# Patient Record
Sex: Male | Born: 1980 | Race: Black or African American | Hispanic: No | Marital: Single
Health system: Southern US, Community
[De-identification: ages and names within clinical notes are randomized; demographics above are authoritative.]

---

## 2018-08-14 ENCOUNTER — Encounter (HOSPITAL_COMMUNITY): Payer: Self-pay | Admitting: Emergency Medicine

## 2018-08-14 ENCOUNTER — Other Ambulatory Visit: Payer: Self-pay

## 2018-08-14 ENCOUNTER — Ambulatory Visit (HOSPITAL_COMMUNITY)
Admission: EM | Admit: 2018-08-14 | Discharge: 2018-08-14 | Disposition: A | Discharge status: Home / Self Care | Payer: BLUE CROSS/BLUE SHIELD | Attending: Family Medicine | Admitting: Family Medicine

## 2018-08-14 DIAGNOSIS — R110 Nausea: Secondary | ICD-10-CM

## 2018-08-14 DIAGNOSIS — B349 Viral infection, unspecified: Secondary | ICD-10-CM

## 2018-08-14 MED ORDER — IBUPROFEN 800 MG PO TABS: 800.0000 mg | ORAL_TABLET | Freq: Three times a day (TID) | ORAL | 0 refills | Status: AC

## 2018-08-14 MED ORDER — ONDANSETRON 4 MG PO TBDP: 4.0000 mg | ORAL_TABLET | Freq: Three times a day (TID) | ORAL | 0 refills | Status: AC | PRN

## 2018-08-14 NOTE — Discharge Instructions (Addendum)
Ibuprofen for pain and fever. Zofran for nausea/vomiting. Keep hydrated, your urine should be clear to pale yellow in color. Monitor for any worsening of symptoms, chest pain, shortness of breath, wheezing, swelling of the throat, follow up for reevaluation.   For sore throat/cough try using a honey-based tea. Use 3 teaspoons of honey with juice squeezed from half lemon. Place shaved pieces of ginger into 1/2-1 cup of water and warm over stove top. Then mix the ingredients and repeat every 4 hours as needed.

## 2018-08-14 NOTE — ED Provider Notes (Signed)
[facility]    CSN: [id number] Arrival date & time: 08/14/18  1111     History   Chief Complaint Chief Complaint  Patient presents with  . Fever    HPI Wesley Diaz is a 37 y.o. male.   37 year old male comes in for 2 day history of cold chills with decreased appetite. Patient speaks S[place], but we did not have Sales executive available. Patient's friend speaks french and sango, so french interpreter was used during this visit. [name] #[id number]. Discussed due to language barrier, may not be able to fully assess patient.  Patient with 2-day history of cold chills with decreased appetite, which she usually associates with having a fever.  He has rhinorrhea and watery eyes, nasal congestion and generalized body aches.  He has nausea when he eats without vomiting.  Denies abdominal pain, diarrhea.  Denies cough.  Former smoker.  No sick contact.  Has not tried any medication.     History reviewed. No pertinent past medical history.  There are no active problems to display for this patient.   History reviewed. No pertinent surgical history.     Home Medications    Prior to Admission medications   Medication Sig Start Date End Date Taking? Authorizing Provider  ibuprofen (ADVIL,MOTRIN) 800 MG tablet Take 1 tablet (800 mg total) by mouth 3 (three) times daily. 08/14/18   Cathie Hoops, Amy V, PA-C  ondansetron (ZOFRAN ODT) 4 MG disintegrating tablet Take 1 tablet (4 mg total) by mouth every 8 (eight) hours as needed for nausea or vomiting. 08/14/18   Belinda Fisher, PA-C    Family History History reviewed. No pertinent family history.  Social History Social History   Tobacco Use  . Smoking status: Not on file  Substance Use Topics  . Alcohol use: Not on file  . Drug use: Not on file     Allergies   Patient has no known allergies.   Review of Systems Review of Systems  Reason unable to perform ROS: See HPI as above.     Physical Exam Triage Vital Signs ED  Triage Vitals [08/14/18 1202]  Enc Vitals Group     BP 112/68     Pulse Rate 92     Resp      Temp 98.3 F (36.8 C)     Temp Source Oral     SpO2 97 %     Weight      Height      Head Circumference      Peak Flow      Pain Score      Pain Loc      Pain Edu?      Excl. in GC?    No data found.  Updated Vital Signs BP 112/68 (BP Location: Left Arm)   Pulse 92   Temp 98.3 F (36.8 C) (Oral)   SpO2 97%   Physical Exam  Constitutional: He is oriented to person, place, and time. He appears well-developed and well-nourished. No distress.  HENT:  Head: Normocephalic and atraumatic.  Right Ear: Tympanic membrane, external ear and ear canal normal. Tympanic membrane is not erythematous and not bulging.  Left Ear: Tympanic membrane, external ear and ear canal normal. Tympanic membrane is not erythematous and not bulging.  Nose: Nose normal. Right sinus exhibits no maxillary sinus tenderness and no frontal sinus tenderness. Left sinus exhibits no maxillary sinus tenderness and no frontal sinus tenderness.  Mouth/Throat: Uvula is midline, oropharynx is clear  and moist and mucous membranes are normal.  Eyes: Pupils are equal, round, and reactive to light. Conjunctivae are normal.  Neck: Normal range of motion. Neck supple.  Cardiovascular: Normal rate, regular rhythm and normal heart sounds. Exam reveals no gallop and no friction rub.  No murmur heard. Pulmonary/Chest: Effort normal and breath sounds normal. No stridor. No respiratory distress. He has no decreased breath sounds. He has no wheezes. He has no rhonchi. He has no rales.  Lymphadenopathy:    He has no cervical adenopathy.  Neurological: He is alert and oriented to person, place, and time.  Skin: Skin is warm and dry.  Psychiatric: He has a normal mood and affect. His behavior is normal. Judgment normal.   UC Treatments / Results  Labs (all labs ordered are listed, but only abnormal results are displayed) Labs Reviewed -  No data to display  EKG None  Radiology No results found.  Procedures Procedures (including critical care time)  Medications Ordered in UC Medications - No data to display  Initial Impression / Assessment and Plan / UC Course  I have reviewed the triage vital signs and the nursing notes.  Pertinent labs & imaging results that were available during my care of the patient were reviewed by me and considered in my medical decision making (see chart for details).    Exam unremarkable, patient nontoxic in appearance. Will treat for viral illness. Ibuprofen for body aches and fever. zofran for nausea. Push fluids. Return precautions given. Patient expresses understanding and agrees to plan.  Final Clinical Impressions(s) / UC Diagnoses   Final diagnoses:  Viral illness  Nausea without vomiting    ED Prescriptions    Medication Sig Dispense Auth. Provider   ibuprofen (ADVIL,MOTRIN) 800 MG tablet Take 1 tablet (800 mg total) by mouth 3 (three) times daily. 21 tablet Yu, Amy V, PA-C   ondansetron (ZOFRAN ODT) 4 MG disintegrating tablet Take 1 tablet (4 mg total) by mouth every 8 (eight) hours as needed for nausea or vomiting. 10 tablet Threasa Alpha, PA-C 08/14/18 1257

## 2018-08-14 NOTE — ED Triage Notes (Addendum)
Made an attempt to use audio Radio producer for [facility], but they did not have anyone on staff that speaks [place].  The pt's friend said we could try [place].  Dot [place]anes #[id number] for [place], but she was not able to fluently speak [place] or [place] and she asked for the pt's MRN and full name.  I informed her I was not authorized to give that information and decided to hang up and wait for a new [place] or [place] interpreter for the provider.  Pt's friend was able to give me some generic information regarding his symptoms.    Pt states he has been feeling cold since yesterday so he thinks he has a fever.  He also reports generalized body aches.

## 2019-01-02 ENCOUNTER — Emergency Department (HOSPITAL_COMMUNITY): Payer: BLUE CROSS/BLUE SHIELD

## 2019-01-02 ENCOUNTER — Encounter (HOSPITAL_COMMUNITY): Payer: Self-pay | Admitting: Emergency Medicine

## 2019-01-02 ENCOUNTER — Emergency Department (HOSPITAL_COMMUNITY)
Admission: EM | Admit: 2019-01-02 | Discharge: 2019-01-02 | Discharge status: Against Medical Advice | Payer: BLUE CROSS/BLUE SHIELD | Attending: Emergency Medicine | Admitting: Emergency Medicine

## 2019-01-02 ENCOUNTER — Ambulatory Visit (INDEPENDENT_AMBULATORY_CARE_PROVIDER_SITE_OTHER)
Admission: EM | Admit: 2019-01-02 | Discharge: 2019-01-02 | Disposition: A | Discharge status: Home / Self Care | Payer: BLUE CROSS/BLUE SHIELD | Source: Home / Self Care

## 2019-01-02 ENCOUNTER — Other Ambulatory Visit: Payer: Self-pay

## 2019-01-02 DIAGNOSIS — Z5321 Procedure and treatment not carried out due to patient leaving prior to being seen by health care provider: Secondary | ICD-10-CM | POA: Insufficient documentation

## 2019-01-02 DIAGNOSIS — R0602 Shortness of breath: Secondary | ICD-10-CM

## 2019-01-02 DIAGNOSIS — R52 Pain, unspecified: Secondary | ICD-10-CM

## 2019-01-02 DIAGNOSIS — R0789 Other chest pain: Secondary | ICD-10-CM | POA: Diagnosis present

## 2019-01-02 LAB — CBC
HCT: 43.2 % (ref 39.0–52.0)
HEMOGLOBIN: 13.7 g/dL (ref 13.0–17.0)
MCH: 29.8 pg (ref 26.0–34.0)
MCHC: 31.7 g/dL (ref 30.0–36.0)
MCV: 93.9 fL (ref 80.0–100.0)
PLATELETS: 188 10*3/uL (ref 150–400)
RBC: 4.6 MIL/uL (ref 4.22–5.81)
RDW: 13.5 % (ref 11.5–15.5)
WBC: 4.5 10*3/uL (ref 4.0–10.5)
nRBC: 0 % (ref 0.0–0.2)

## 2019-01-02 LAB — BASIC METABOLIC PANEL
ANION GAP: 9 (ref 5–15)
BUN: 13 mg/dL (ref 6–20)
CO2: 23 mmol/L (ref 22–32)
Calcium: 9.5 mg/dL (ref 8.9–10.3)
Chloride: 106 mmol/L (ref 98–111)
Creatinine, Ser: 0.71 mg/dL (ref 0.61–1.24)
Glucose, Bld: 80 mg/dL (ref 70–99)
POTASSIUM: 3.9 mmol/L (ref 3.5–5.1)
SODIUM: 138 mmol/L (ref 135–145)

## 2019-01-02 LAB — I-STAT TROPONIN, ED: TROPONIN I, POC: 0 ng/mL (ref 0.00–0.08)

## 2019-01-02 MED ORDER — SODIUM CHLORIDE 0.9% FLUSH: 3.0000 mL | Freq: Once | INTRAVENOUS | Status: DC

## 2019-01-02 NOTE — ED Notes (Signed)
Called patient twice patient didn't answer lobby staff stated they don't see him anymore

## 2019-01-02 NOTE — ED Provider Notes (Signed)
I did not see or evaluate the patient.  The patient left the emergency department prior to my evaluation.   Azalia Bilis, MD 01/02/19 [phone]

## 2019-01-02 NOTE — ED Triage Notes (Signed)
Patient arrives with a family member, family member states that patient is complaining of body aches, cough. He denies sore throat, states he has a little pain in his chest also. Unable to get a sango interpretor which is the language that patient speaks, triage was done with french interpretor and patient's family member. resp e/u, nad.

## 2020-01-01 IMAGING — DX DG CHEST 2V
2 series · 2 of 2 positions shown · non-contrast
Comparison: None.

CLINICAL DATA: Left-sided chest pain for several days

EXAM:
CHEST - 2 VIEW

[chest pa]
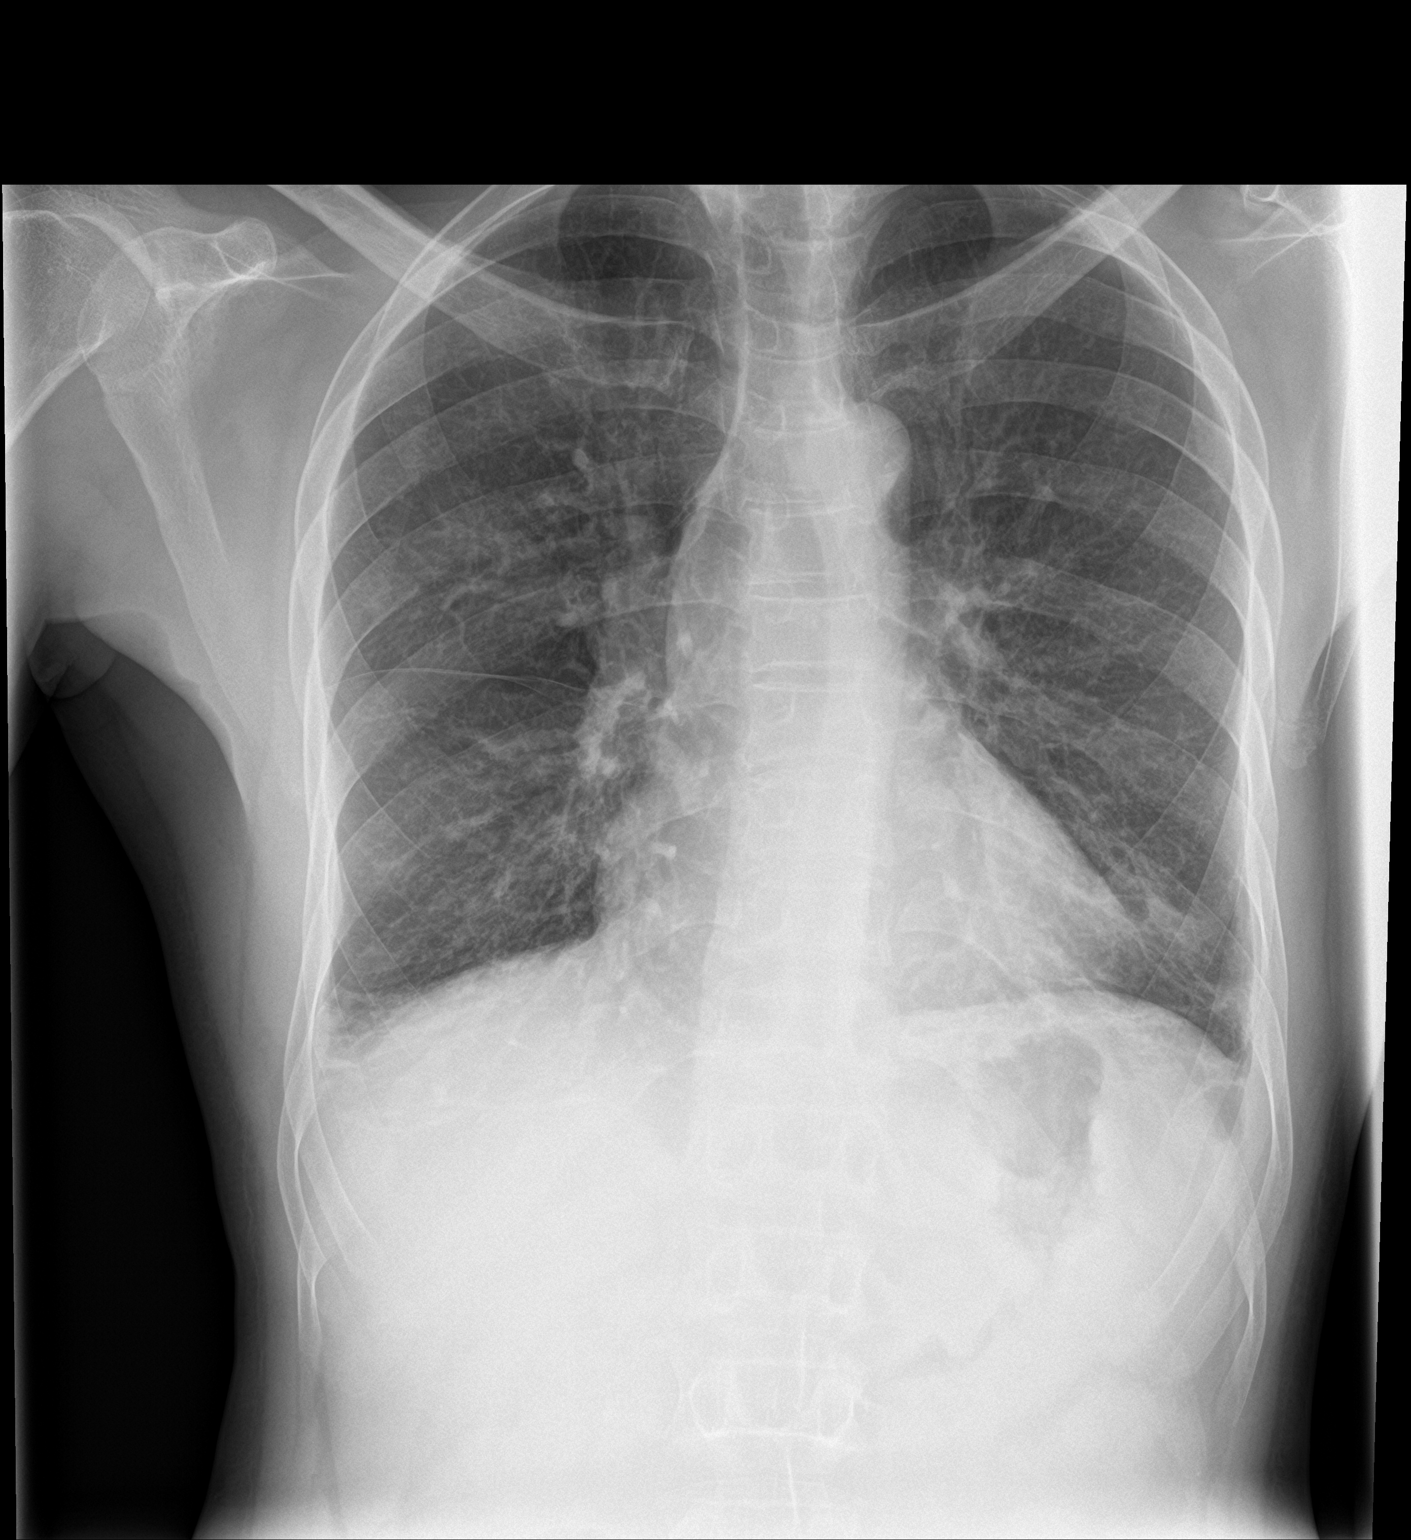

[chest lat]
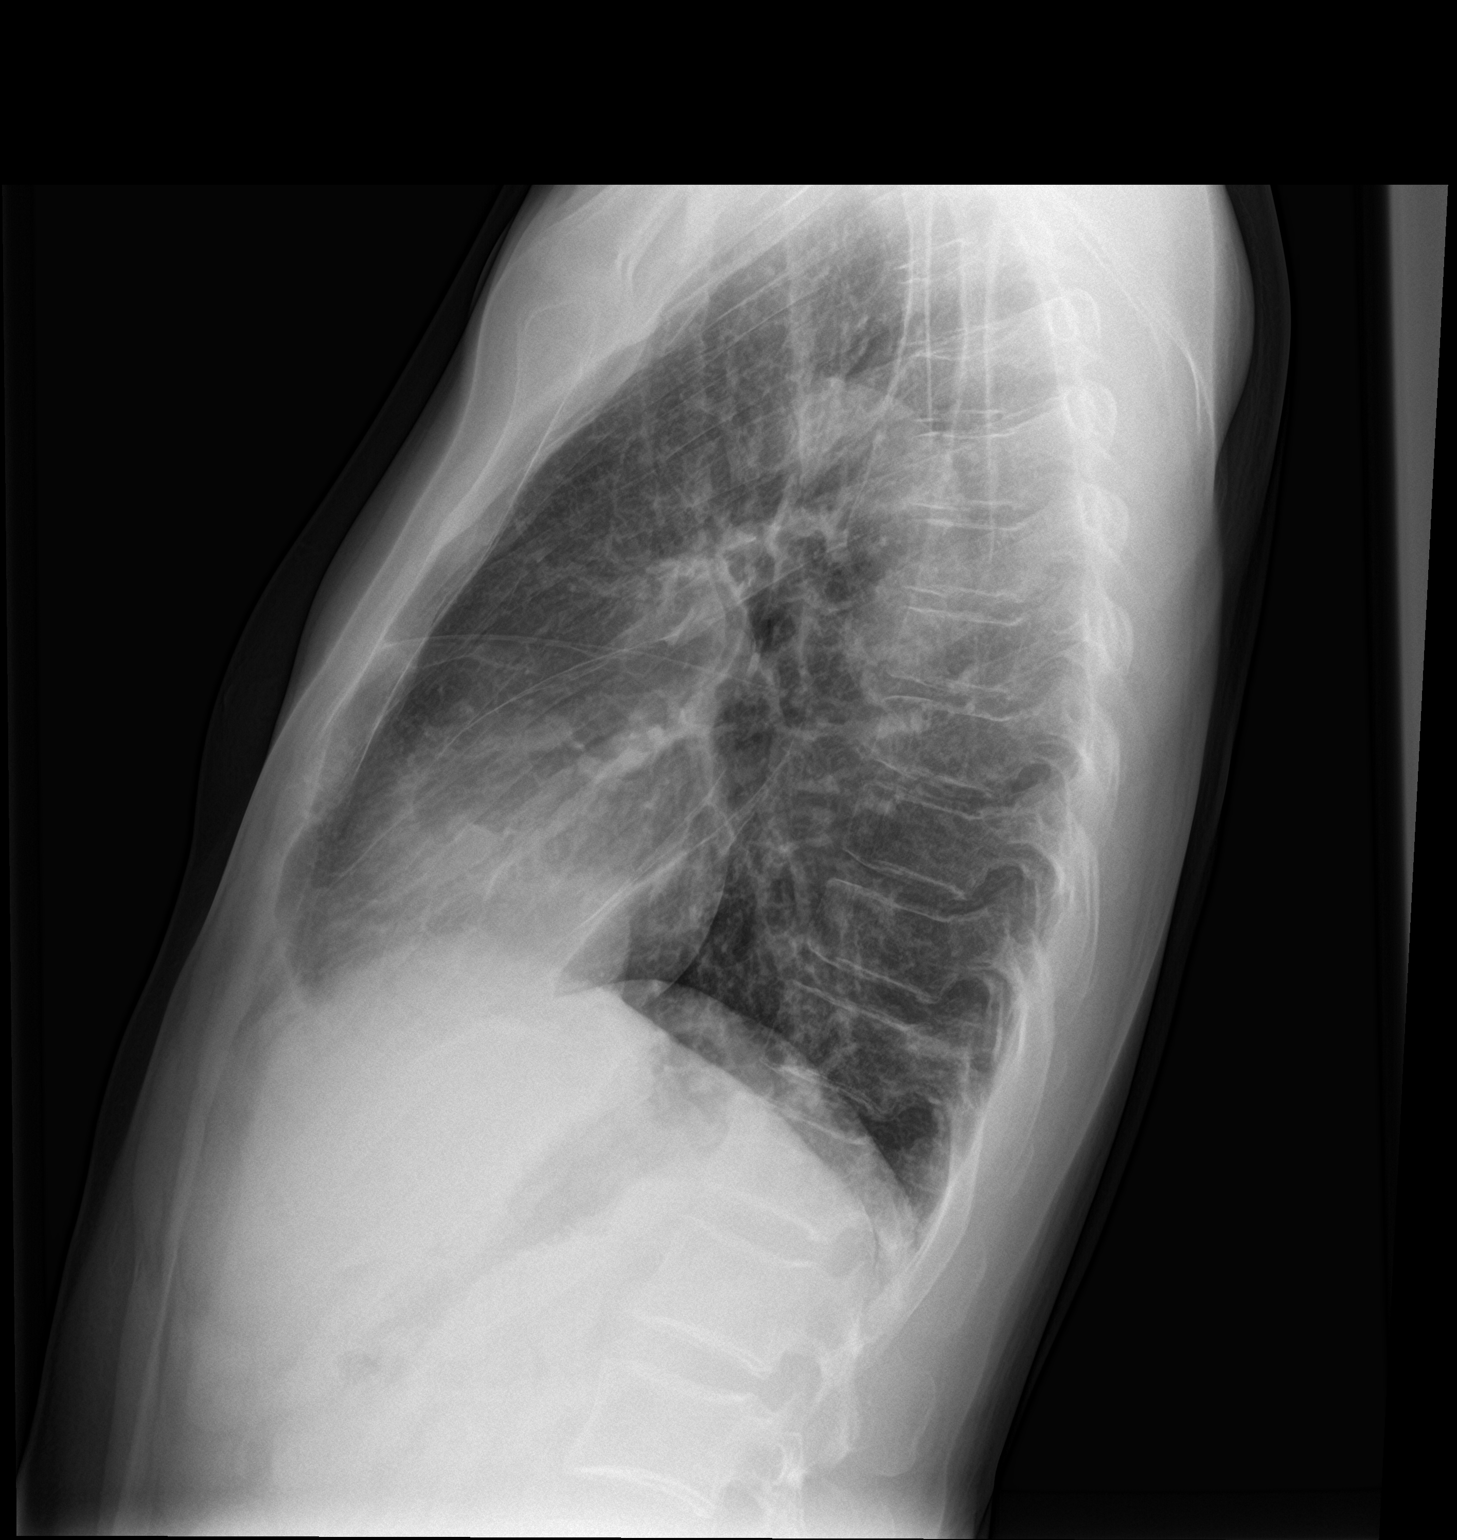

[2 of 2 positions shown; findings below may reference images not displayed]

FINDINGS: Cardiac shadow is within normal limits. The lungs are well aerated
bilaterally. Patchy lingular infiltrate is seen on the right.
Additionally some right basilar atelectasis is noted. No bony
abnormality is seen.
IMPRESSION: Mild bibasilar opacities slightly greater on the left than the
right.

## 2020-10-20 ENCOUNTER — Other Ambulatory Visit: Payer: Self-pay

## 2020-10-20 ENCOUNTER — Ambulatory Visit: Payer: Self-pay
